# Patient Record
Sex: Male | Born: 1970 | Race: White | Hispanic: No | Marital: Married | Smoking: Heavy tobacco smoker
Health system: Southern US, Community
[De-identification: ages and names within clinical notes are randomized; demographics above are authoritative.]

## PROBLEM LIST (undated history)

## (undated) DIAGNOSIS — I10 Essential (primary) hypertension: Secondary | ICD-10-CM

---

## 2006-06-02 ENCOUNTER — Emergency Department: Payer: Self-pay | Admitting: Emergency Medicine

## 2016-04-18 DIAGNOSIS — S02609A Fracture of mandible, unspecified, initial encounter for closed fracture: Secondary | ICD-10-CM | POA: Insufficient documentation

## 2016-04-18 DIAGNOSIS — Z789 Other specified health status: Secondary | ICD-10-CM | POA: Insufficient documentation

## 2016-04-18 DIAGNOSIS — F17201 Nicotine dependence, unspecified, in remission: Secondary | ICD-10-CM | POA: Insufficient documentation

## 2016-04-18 DIAGNOSIS — Z7289 Other problems related to lifestyle: Secondary | ICD-10-CM | POA: Insufficient documentation

## 2016-04-18 DIAGNOSIS — I10 Essential (primary) hypertension: Secondary | ICD-10-CM | POA: Insufficient documentation

## 2017-12-07 ENCOUNTER — Other Ambulatory Visit: Payer: Self-pay | Admitting: *Deleted

## 2017-12-07 ENCOUNTER — Ambulatory Visit
Admission: RE | Admit: 2017-12-07 | Discharge: 2017-12-07 | Disposition: A | Discharge status: Home / Self Care | Payer: Disability Insurance | Source: Ambulatory Visit | Attending: *Deleted | Admitting: *Deleted

## 2017-12-07 ENCOUNTER — Ambulatory Visit
Admission: RE | Admit: 2017-12-07 | Discharge: 2017-12-07 | Disposition: A | Discharge status: Home / Self Care | Payer: Disability Insurance | Source: Ambulatory Visit | Attending: Ophthalmology | Admitting: Ophthalmology

## 2017-12-07 DIAGNOSIS — M47816 Spondylosis without myelopathy or radiculopathy, lumbar region: Secondary | ICD-10-CM | POA: Insufficient documentation

## 2017-12-07 DIAGNOSIS — G8929 Other chronic pain: Secondary | ICD-10-CM | POA: Diagnosis not present

## 2017-12-07 DIAGNOSIS — M542 Cervicalgia: Secondary | ICD-10-CM | POA: Diagnosis not present

## 2017-12-07 DIAGNOSIS — Z8781 Personal history of (healed) traumatic fracture: Secondary | ICD-10-CM | POA: Diagnosis not present

## 2017-12-07 DIAGNOSIS — I700 Atherosclerosis of aorta: Secondary | ICD-10-CM | POA: Insufficient documentation

## 2017-12-07 DIAGNOSIS — M79672 Pain in left foot: Secondary | ICD-10-CM | POA: Diagnosis not present

## 2017-12-07 DIAGNOSIS — M545 Low back pain: Principal | ICD-10-CM

## 2017-12-07 DIAGNOSIS — M899 Disorder of bone, unspecified: Secondary | ICD-10-CM | POA: Insufficient documentation

## 2017-12-20 ENCOUNTER — Other Ambulatory Visit: Payer: Self-pay

## 2017-12-20 ENCOUNTER — Encounter: Payer: Self-pay | Admitting: Emergency Medicine

## 2017-12-20 ENCOUNTER — Emergency Department: Payer: Self-pay

## 2017-12-20 ENCOUNTER — Inpatient Hospital Stay
Admission: EM | Admit: 2017-12-20 | Discharge: 2017-12-22 | DRG: 193 | Disposition: A | Discharge status: Home / Self Care | Payer: Self-pay | Attending: Internal Medicine | Admitting: Internal Medicine

## 2017-12-20 DIAGNOSIS — J440 Chronic obstructive pulmonary disease with acute lower respiratory infection: Secondary | ICD-10-CM | POA: Diagnosis present

## 2017-12-20 DIAGNOSIS — Z8042 Family history of malignant neoplasm of prostate: Secondary | ICD-10-CM

## 2017-12-20 DIAGNOSIS — J441 Chronic obstructive pulmonary disease with (acute) exacerbation: Secondary | ICD-10-CM

## 2017-12-20 DIAGNOSIS — J9601 Acute respiratory failure with hypoxia: Secondary | ICD-10-CM

## 2017-12-20 DIAGNOSIS — F17210 Nicotine dependence, cigarettes, uncomplicated: Secondary | ICD-10-CM | POA: Diagnosis present

## 2017-12-20 DIAGNOSIS — I10 Essential (primary) hypertension: Secondary | ICD-10-CM | POA: Diagnosis present

## 2017-12-20 DIAGNOSIS — Z23 Encounter for immunization: Secondary | ICD-10-CM

## 2017-12-20 DIAGNOSIS — J189 Pneumonia, unspecified organism: Principal | ICD-10-CM

## 2017-12-20 HISTORY — DX: Essential (primary) hypertension: I10

## 2017-12-20 LAB — FIBRIN DERIVATIVES D-DIMER (ARMC ONLY): Fibrin derivatives D-dimer (ARMC): 736.72 ng/mL (FEU) — ABNORMAL HIGH (ref 0.00–499.00)

## 2017-12-20 LAB — BRAIN NATRIURETIC PEPTIDE: B NATRIURETIC PEPTIDE 5: 26 pg/mL (ref 0.0–100.0)

## 2017-12-20 LAB — BASIC METABOLIC PANEL
Anion gap: 12 (ref 5–15)
BUN: 11 mg/dL (ref 6–20)
CO2: 21 mmol/L — ABNORMAL LOW (ref 22–32)
CREATININE: 0.8 mg/dL (ref 0.61–1.24)
Calcium: 8.6 mg/dL — ABNORMAL LOW (ref 8.9–10.3)
Chloride: 102 mmol/L (ref 101–111)
GFR calc Af Amer: >60 mL/min (ref >60)
Glucose, Bld: 118 mg/dL — ABNORMAL HIGH (ref 65–99)
POTASSIUM: 4.3 mmol/L (ref 3.5–5.1)
SODIUM: 135 mmol/L (ref 135–145)

## 2017-12-20 LAB — BLOOD GAS, VENOUS
Acid-base deficit: 2.4 mmol/L — ABNORMAL HIGH (ref 0.0–2.0)
Bicarbonate: 24.2 mmol/L (ref 20.0–28.0)
O2 SAT: 98 %
PATIENT TEMPERATURE: 37
pCO2, Ven: 47 mmHg (ref 44.0–60.0)
pH, Ven: 7.32 (ref 7.250–7.430)
pO2, Ven: 112 mmHg — ABNORMAL HIGH (ref 32.0–45.0)

## 2017-12-20 LAB — PROCALCITONIN

## 2017-12-20 LAB — CBC WITH DIFFERENTIAL/PLATELET
Basophils Absolute: 0 10*3/uL (ref 0–0.1)
Basophils Relative: 0 %
EOS ABS: 1.2 10*3/uL — AB (ref 0–0.7)
EOS PCT: 9 %
HCT: 46.1 % (ref 40.0–52.0)
Hemoglobin: 15.4 g/dL (ref 13.0–18.0)
Lymphocytes Relative: 7 %
Lymphs Abs: 0.9 10*3/uL — ABNORMAL LOW (ref 1.0–3.6)
MCH: 30.9 pg (ref 26.0–34.0)
MCHC: 33.4 g/dL (ref 32.0–36.0)
MCV: 92.6 fL (ref 80.0–100.0)
MONOS PCT: 4 %
Monocytes Absolute: 0.5 10*3/uL (ref 0.2–1.0)
Neutro Abs: 11.2 10*3/uL — ABNORMAL HIGH (ref 1.4–6.5)
Neutrophils Relative %: 80 %
PLATELETS: 201 10*3/uL (ref 150–440)
RBC: 4.98 MIL/uL (ref 4.40–5.90)
RDW: 13.8 % (ref 11.5–14.5)
WBC: 13.9 10*3/uL — ABNORMAL HIGH (ref 3.8–10.6)

## 2017-12-20 LAB — LIPID PANEL
Cholesterol: 144 mg/dL (ref 0–200)
HDL: 34 mg/dL — ABNORMAL LOW (ref >40)
LDL CALC: 94 mg/dL (ref 0–99)
Total CHOL/HDL Ratio: 4.2 RATIO
Triglycerides: 81 mg/dL (ref <150)
VLDL: 16 mg/dL (ref 0–40)

## 2017-12-20 LAB — HEMOGLOBIN A1C
Hgb A1c MFr Bld: 5.3 % (ref 4.8–5.6)
Mean Plasma Glucose: 105.41 mg/dL

## 2017-12-20 LAB — GLUCOSE, CAPILLARY: Glucose-Capillary: 114 mg/dL — ABNORMAL HIGH (ref 65–99)

## 2017-12-20 LAB — MRSA PCR SCREENING: MRSA by PCR: NEGATIVE

## 2017-12-20 LAB — TROPONIN I: Troponin I: <0.03 ng/mL (ref <0.03)

## 2017-12-20 LAB — LACTIC ACID, PLASMA: LACTIC ACID, VENOUS: 0.9 mmol/L (ref 0.5–1.9)

## 2017-12-20 MED ORDER — SODIUM CHLORIDE 0.9 % IV SOLN
500.0000 mg | Freq: Once | INTRAVENOUS | Status: AC
  Administered 2017-12-20: 500 mg via INTRAVENOUS
  Filled 2017-12-20: qty 500

## 2017-12-20 MED ORDER — PNEUMOCOCCAL VAC POLYVALENT 25 MCG/0.5ML IJ INJ
0.5000 mL | INJECTION | INTRAMUSCULAR | Status: AC
  Administered 2017-12-22: 0.5 mL via INTRAMUSCULAR
  Filled 2017-12-20: qty 0.5

## 2017-12-20 MED ORDER — MENTHOL 3 MG MT LOZG
1.0000 | LOZENGE | OROMUCOSAL | Status: DC | PRN
  Administered 2017-12-20: 3 mg via ORAL
  Filled 2017-12-20: qty 9

## 2017-12-20 MED ORDER — DEXTROSE 5 % IV SOLN: 250.0000 mg | INTRAVENOUS | Status: DC

## 2017-12-20 MED ORDER — PREDNISONE 50 MG PO TABS
50.0000 mg | ORAL_TABLET | Freq: Every day | ORAL | Status: DC
  Administered 2017-12-21 – 2017-12-22 (×2): 50 mg via ORAL
  Filled 2017-12-20 (×2): qty 1

## 2017-12-20 MED ORDER — IPRATROPIUM-ALBUTEROL 0.5-2.5 (3) MG/3ML IN SOLN
RESPIRATORY_TRACT | Status: AC
  Administered 2017-12-20: 3 mL via RESPIRATORY_TRACT
  Filled 2017-12-20: qty 6

## 2017-12-20 MED ORDER — BUDESONIDE 0.25 MG/2ML IN SUSP: 0.2500 mg | Freq: Two times a day (BID) | RESPIRATORY_TRACT | Status: DC

## 2017-12-20 MED ORDER — BUDESONIDE 0.5 MG/2ML IN SUSP
0.5000 mg | Freq: Two times a day (BID) | RESPIRATORY_TRACT | Status: DC
  Administered 2017-12-20 – 2017-12-22 (×4): 0.5 mg via RESPIRATORY_TRACT
  Filled 2017-12-20 (×4): qty 2

## 2017-12-20 MED ORDER — SODIUM CHLORIDE 0.9 % IV SOLN
1.0000 g | Freq: Once | INTRAVENOUS | Status: AC
  Administered 2017-12-20: 1 g via INTRAVENOUS
  Filled 2017-12-20: qty 10

## 2017-12-20 MED ORDER — IPRATROPIUM-ALBUTEROL 0.5-2.5 (3) MG/3ML IN SOLN: 3.0000 mL | RESPIRATORY_TRACT | Status: DC

## 2017-12-20 MED ORDER — DOCUSATE SODIUM 100 MG PO CAPS: 100.0000 mg | ORAL_CAPSULE | Freq: Two times a day (BID) | ORAL | Status: DC | PRN

## 2017-12-20 MED ORDER — ENOXAPARIN SODIUM 40 MG/0.4ML ~~LOC~~ SOLN
40.0000 mg | SUBCUTANEOUS | Status: DC
Start: 2017-12-20 — End: 2017-12-20

## 2017-12-20 MED ORDER — NICOTINE 21 MG/24HR TD PT24
21.0000 mg | MEDICATED_PATCH | Freq: Every day | TRANSDERMAL | Status: DC
  Administered 2017-12-20 – 2017-12-22 (×3): 21 mg via TRANSDERMAL
  Filled 2017-12-20 (×3): qty 1

## 2017-12-20 MED ORDER — TIOTROPIUM BROMIDE MONOHYDRATE 18 MCG IN CAPS
18.0000 ug | ORAL_CAPSULE | Freq: Every day | RESPIRATORY_TRACT | Status: DC
  Filled 2017-12-20: qty 5

## 2017-12-20 MED ORDER — ENOXAPARIN SODIUM 40 MG/0.4ML ~~LOC~~ SOLN
40.0000 mg | SUBCUTANEOUS | Status: DC
  Administered 2017-12-20 – 2017-12-22 (×3): 40 mg via SUBCUTANEOUS
  Filled 2017-12-20 (×3): qty 0.4

## 2017-12-20 MED ORDER — ACETAMINOPHEN 325 MG PO TABS
650.0000 mg | ORAL_TABLET | Freq: Four times a day (QID) | ORAL | Status: DC | PRN
  Administered 2017-12-20 – 2017-12-21 (×3): 650 mg via ORAL
  Filled 2017-12-20 (×2): qty 2

## 2017-12-20 MED ORDER — CEFTRIAXONE SODIUM 1 G IJ SOLR
1.0000 g | INTRAMUSCULAR | Status: DC
  Administered 2017-12-21 – 2017-12-22 (×2): 1 g via INTRAVENOUS
  Filled 2017-12-20: qty 10
  Filled 2017-12-20 (×2): qty 1

## 2017-12-20 MED ORDER — IPRATROPIUM BROMIDE 0.02 % IN SOLN
0.5000 mg | RESPIRATORY_TRACT | Status: DC
  Administered 2017-12-20 – 2017-12-21 (×7): 0.5 mg via RESPIRATORY_TRACT
  Filled 2017-12-20 (×7): qty 2.5

## 2017-12-20 MED ORDER — SODIUM CHLORIDE 0.9 % IV SOLN
500.0000 mg | INTRAVENOUS | Status: DC
  Administered 2017-12-21 – 2017-12-22 (×2): 500 mg via INTRAVENOUS
  Filled 2017-12-20 (×3): qty 500

## 2017-12-20 MED ORDER — METOPROLOL TARTRATE 5 MG/5ML IV SOLN
2.5000 mg | Freq: Four times a day (QID) | INTRAVENOUS | Status: DC | PRN
  Administered 2017-12-20: 2.5 mg via INTRAVENOUS
  Filled 2017-12-20: qty 5

## 2017-12-20 MED ORDER — SODIUM CHLORIDE 0.9 % IV SOLN: 1.0000 g | INTRAVENOUS | Status: DC

## 2017-12-20 MED ORDER — IPRATROPIUM-ALBUTEROL 0.5-2.5 (3) MG/3ML IN SOLN
3.0000 mL | Freq: Once | RESPIRATORY_TRACT | Status: AC
  Administered 2017-12-20: 3 mL via RESPIRATORY_TRACT

## 2017-12-20 MED ORDER — PHENOL 1.4 % MT LIQD
1.0000 | OROMUCOSAL | Status: DC | PRN
  Administered 2017-12-20: 1 via OROMUCOSAL
  Filled 2017-12-20: qty 177

## 2017-12-20 MED ORDER — METHYLPREDNISOLONE SODIUM SUCC 125 MG IJ SOLR
125.0000 mg | Freq: Once | INTRAMUSCULAR | Status: AC
  Administered 2017-12-20: 125 mg via INTRAVENOUS

## 2017-12-20 MED ORDER — OXYCODONE-ACETAMINOPHEN 5-325 MG PO TABS
1.0000 | ORAL_TABLET | ORAL | Status: DC | PRN
  Administered 2017-12-20 – 2017-12-22 (×6): 1 via ORAL
  Filled 2017-12-20 (×6): qty 1

## 2017-12-20 MED ORDER — SODIUM CHLORIDE 0.9 % IV BOLUS
1000.0000 mL | Freq: Once | INTRAVENOUS | Status: AC
  Administered 2017-12-20: 1000 mL via INTRAVENOUS

## 2017-12-20 MED ORDER — ALBUTEROL SULFATE (2.5 MG/3ML) 0.083% IN NEBU: 5.0000 mg | INHALATION_SOLUTION | Freq: Once | RESPIRATORY_TRACT | Status: DC

## 2017-12-20 MED ORDER — ACETAMINOPHEN 325 MG PO TABS
ORAL_TABLET | ORAL | Status: AC
  Filled 2017-12-20: qty 2

## 2017-12-20 MED ORDER — IPRATROPIUM-ALBUTEROL 0.5-2.5 (3) MG/3ML IN SOLN
RESPIRATORY_TRACT | Status: AC
  Administered 2017-12-20: 3 mL
  Filled 2017-12-20: qty 3

## 2017-12-20 MED ORDER — METHYLPREDNISOLONE SODIUM SUCC 125 MG IJ SOLR: 60.0000 mg | Freq: Four times a day (QID) | INTRAMUSCULAR | Status: DC

## 2017-12-20 MED ORDER — METHYLPREDNISOLONE SODIUM SUCC 125 MG IJ SOLR
INTRAMUSCULAR | Status: AC
  Administered 2017-12-20: 125 mg via INTRAVENOUS
  Filled 2017-12-20: qty 2

## 2017-12-20 NOTE — ED Notes (Signed)
Admitting provider in room at this time.  Will continue to monitor.

## 2017-12-20 NOTE — ED Provider Notes (Signed)
[facility] Emergency Department Provider Note   First MD Initiated Contact with Patient 12/20/17 [phone]     (approximate)  I have reviewed the triage vital signs and the nursing notes.   HISTORY  Chief Complaint Shortness of Breath and Cough    HPI Mathew Mitchell is a 47 y.o. male history of's cigarette smoking x25 years recently quit 5 days ago presents to the emergency department with shortness of breath and productive cough x1 week.  Patient denies any fever afebrile on presentation with temperature of 98.  Patient denies any lower extremity pain or swelling.  Patient states that he used nebulizer at home without any improvement of symptoms.  Patient denies any chest pain   Past medical history None There are no active problems to display for this patient.   Past surgical history Tracheostomy  Prior to Admission medications   Not on File    Allergies No known drug allergies History reviewed. No pertinent family history.  Social History Social History   Tobacco Use  . Smoking status: Heavy Tobacco Smoker  . Smokeless tobacco: Never Used  Substance Use Topics  . Alcohol use: Yes  . Drug use: Never    Review of Systems Constitutional: No fever/chills Eyes: No visual changes. ENT: No sore throat. Cardiovascular: Denies chest pain. Respiratory: Positive for dyspnea and cough Gastrointestinal: No abdominal pain.  No nausea, no vomiting.  No diarrhea.  No constipation. Genitourinary: Negative for dysuria. Musculoskeletal: Negative for neck pain.  Negative for back pain. Integumentary: Negative for rash. Neurological: Negative for headaches, focal weakness or numbness.   ____________________________________________   PHYSICAL EXAM:  VITAL SIGNS: ED Triage Vitals [12/20/17 0526]  Enc Vitals Group     BP (!) 162/105     Pulse Rate 88     Resp (!) 24     Temp 98 F (36.7 C)     Temp Source Oral     SpO2 (!) 88 %     Weight  101.6 kg (224 lb)     Height 1.88 m ( )     Head Circumference      Peak Flow      Pain Score 0     Pain Loc      Pain Edu?      Excl. in GC?     Constitutional: Alert and oriented.  Apparent respiratory distress Eyes: Conjunctivae are normal.  Head: Atraumatic. Mouth/Throat: Mucous membranes are moist.  Oropharynx non-erythematous. Neck: No stridor.   Cardiovascular: Normal rate, regular rhythm. Good peripheral circulation. Grossly normal heart sounds. Respiratory: Tachypnea, positive accessory respiratory muscle use, diffuse rhonchi with wheezing Gastrointestinal: Soft and nontender. No distention.  Musculoskeletal: No lower extremity tenderness nor edema. No gross deformities of extremities. Neurologic:  Normal speech and language. No gross focal neurologic deficits are appreciated.  Skin:  Skin is warm, dry and intact. No rash noted. Psychiatric: Mood and affect are normal. Speech and behavior are normal.  ____________________________________________   LABS (all labs ordered are listed, but only abnormal results are displayed)  Labs Reviewed  CBC WITH DIFFERENTIAL/PLATELET  BASIC METABOLIC PANEL  BRAIN NATRIURETIC PEPTIDE  TROPONIN I  FIBRIN DERIVATIVES D-DIMER ([facility] ONLY)  BLOOD GAS, VENOUS   ____________________________________________  EKG  ED ECG REPORT I, [place] BROWN, the attending physician, personally viewed and interpreted this ECG.   Date: 12/20/2017  EKG Time: 5:38 AM  Rate: 137  Rhythm: Sinus tachycardia  Axis: Normal  Intervals: Normal  ST&T  Change: None  ____________________________________________  RADIOLOGY I, [place] Dewayne Shorter, personally viewed and evaluated these images (plain radiographs) as part of my medical decision making, as well as reviewing the written report by the radiologist.  ED MD interpretation: Bibasilar opacities  Official radiology report(s): Dg Chest Portable 1 View  Result Date: 12/20/2017 CLINICAL DATA:   Acute onset of shortness of breath and cough. EXAM: PORTABLE CHEST 1 VIEW COMPARISON:  None. FINDINGS: The lungs are well-aerated. Minimal bibasilar opacities may reflect atelectasis. There is no evidence of pleural effusion or pneumothorax. The cardiomediastinal silhouette is within normal limits. No acute osseous abnormalities are seen. IMPRESSION: Minimal bibasilar opacities may reflect atelectasis. Lungs otherwise clear. Electronically Signed   By: Roanna Raider M.D.   On: 12/20/2017 06:14      .Critical Care Performed by: Darci Current, MD Authorized by: Darci Current, MD   Critical care provider statement:    Critical care time (minutes):  45   Critical care time was exclusive of:  Separately billable procedures and treating other patients   Critical care was necessary to treat or prevent imminent or life-threatening deterioration of the following conditions:  Respiratory failure   Critical care was time spent personally by me on the following activities:  Development of treatment plan with patient or surrogate, discussions with consultants, evaluation of patient's response to treatment, examination of patient, obtaining history from patient or surrogate, ordering and performing treatments and interventions, ordering and review of laboratory studies, ordering and review of radiographic studies, pulse oximetry, re-evaluation of patient's condition and review of old charts   I assumed direction of critical care for this patient from another provider in my specialty: no       ____________________________________________   INITIAL IMPRESSION / ASSESSMENT AND PLAN / ED COURSE  As part of my medical decision making, I reviewed the following data within the electronic MEDICAL RECORD NUMBER   47 year old male presents with above-stated history and physical exams secondary to respiratory distress.  Patient given 3 duo nebs in the emergency department as well as IV Solu-Medrol on 25 mg.  On  reevaluation patient continues to have diffuse rhonchi with expiratory wheezes as well as accessory muscle use tachypnea and as such BiPAP applied.  Chest x-ray revealed "bibasilar atelectasis however I suspect this to be pneumonia given clinical findings.  As such patient was given ceftriaxone 1 g and azithromycin 500 mg.  Patient discussed with Dr. Sheryle Hail for hospital admission further evaluation and management of acute hypoxia with respiratory failure ____________________________________________  FINAL CLINICAL IMPRESSION(S) / ED DIAGNOSES  Final diagnoses:  Community acquired pneumonia, unspecified laterality  Acute respiratory failure with hypoxia (HCC)     MEDICATIONS GIVEN DURING THIS VISIT:  Medications  sodium chloride 0.9 % bolus 1,000 mL (1,000 mLs Intravenous New Bag/Given 12/20/17 0607)  ipratropium-albuterol (DUONEB) 0.5-2.5 (3) MG/3ML nebulizer solution (3 mLs  Given 12/20/17 0540)  ipratropium-albuterol (DUONEB) 0.5-2.5 (3) MG/3ML nebulizer solution 3 mL (3 mLs Nebulization Given 12/20/17 0540)  methylPREDNISolone sodium succinate (SOLU-MEDROL) 125 mg/2 mL injection 125 mg (125 mg Intravenous Given 12/20/17 0540)  ipratropium-albuterol (DUONEB) 0.5-2.5 (3) MG/3ML nebulizer solution 3 mL (3 mLs Nebulization Given 12/20/17 0541)     ED Discharge Orders    None       Note:  This document was prepared using Dragon voice recognition software and may include unintentional dictation errors.    Darci Current, MD 12/20/17 [phone]

## 2017-12-20 NOTE — ED Notes (Signed)
Lab called about blood work, states they see orders now and will run the blood specimens.

## 2017-12-20 NOTE — ED Notes (Signed)
ED Provider at bedside. 

## 2017-12-20 NOTE — ED Notes (Signed)
Pt being placed on bipap 

## 2017-12-20 NOTE — H&P (Signed)
[employer] [facility] [place] at [facility]   PATIENT NAME: Mathew Mitchell    MR#:  [id number]  DATE OF BIRTH:  1971-04-07  DATE OF ADMISSION:  12/20/2017  PRIMARY CARE PHYSICIAN: Patient, No Pcp Per   REQUESTING/REFERRING PHYSICIAN: Manson Passey  CHIEF COMPLAINT:   Chief Complaint  Patient presents with  . Shortness of Breath  . Cough    HISTORY OF PRESENT ILLNESS: Mathew Mitchell  is a 47 y.o. male with a known history of htn, Does not go to a doctor- smokes 2 packs of cigarates daily- came to ER for having SOB and cough worsening for 4 days. Also have brownish sputum. No fever, chills, chest pain. Noted to be hypoxic in ER. Have some infiltrate in Xray. Did not benefit by nasal canula or mask, so started on bipap and feels batter with that.  PAST MEDICAL HISTORY:   Past Medical History:  Diagnosis Date  . Hypertension     PAST SURGICAL HISTORY: History reviewed. No pertinent surgical history.  SOCIAL HISTORY:  Social History   Tobacco Use  . Smoking status: Heavy Tobacco Smoker    Packs/day: 2.00  . Smokeless tobacco: Never Used  Substance Use Topics  . Alcohol use: Yes    FAMILY HISTORY:  Family History  Problem Relation Age of Onset  . Diabetes Mother   . Prostate cancer Father     DRUG ALLERGIES: No Known Allergies  REVIEW OF SYSTEMS:   CONSTITUTIONAL: No fever, fatigue or weakness.  EYES: No blurred or double vision.  EARS, NOSE, AND THROAT: No tinnitus or ear pain.  RESPIRATORY: have cough, shortness of breath,no wheezing or hemoptysis.  CARDIOVASCULAR: No chest pain, orthopnea, edema.  GASTROINTESTINAL: No nausea, vomiting, diarrhea or abdominal pain.  GENITOURINARY: No dysuria, hematuria.  ENDOCRINE: No polyuria, nocturia,  HEMATOLOGY: No anemia, easy bruising or bleeding SKIN: No rash or lesion. MUSCULOSKELETAL: No joint pain or arthritis.   NEUROLOGIC: No tingling, numbness, weakness.  PSYCHIATRY: No anxiety or depression.   MEDICATIONS AT  HOME:  Prior to Admission medications   Medication Sig Start Date End Date Taking? Authorizing Provider  guaiFENesin (MUCINEX) 600 MG 12 hr tablet Take 600 mg by mouth 2 (two) times daily as needed for cough.   Yes [provider]  ibuprofen (ADVIL,MOTRIN) 200 MG tablet Take 200 mg by mouth every 6 (six) hours as needed for headache or mild pain.   Yes [provider]      PHYSICAL EXAMINATION:   VITAL SIGNS: Blood pressure (!) 144/95, pulse (!) 130, temperature 98 F (36.7 C), temperature source Oral, resp. rate (!) 25, height  (1.88 m), weight 101.6 kg (224 lb), SpO2 93 %.  GENERAL:  47 y.o.-year-old patient lying in the bed with no acute distress.  EYES: Pupils equal, round, reactive to light and accommodation. No scleral icterus. Extraocular muscles intact.  HEENT: Head atraumatic, normocephalic. Oropharynx and nasopharynx clear.  NECK:  Supple, no jugular venous distention. No thyroid enlargement, no tenderness.  LUNGS: Normal breath sounds bilaterally, some wheezing, some crepitation. Positive for use of accessory muscles of respiration. On bipap. CARDIOVASCULAR: S1, S2 normal. No murmurs, rubs, or gallops.  ABDOMEN: Soft, nontender, nondistended. Bowel sounds present. No organomegaly or mass.  EXTREMITIES: No pedal edema, cyanosis, or clubbing.  NEUROLOGIC: Cranial nerves II through XII are intact. Muscle strength 5/5 in all extremities. Sensation intact. Gait not checked.  PSYCHIATRIC: The patient is alert and oriented x 3.  SKIN: No obvious rash, lesion, or ulcer.  LABORATORY PANEL:   CBC Recent Labs  Lab 12/20/17 0659  WBC 13.9*  HGB 15.4  HCT 46.1  PLT 201  MCV 92.6  MCH 30.9  MCHC 33.4  RDW 13.8  LYMPHSABS 0.9*  MONOABS 0.5  EOSABS 1.2*  BASOSABS 0.0   ------------------------------------------------------------------------------------------------------------------  Chemistries  Recent Labs  Lab 12/20/17 0659  NA 135  K 4.3  CL  102  CO2 21*  GLUCOSE 118*  BUN 11  CREATININE 0.80  CALCIUM 8.6*   ------------------------------------------------------------------------------------------------------------------ estimated creatinine clearance is 143.8 mL/min (by C-G formula based on SCr of 0.8 mg/dL). ------------------------------------------------------------------------------------------------------------------ No results for input(s): TSH, T4TOTAL, T3FREE, THYROIDAB in the last 72 hours.  Invalid input(s): FREET3   Coagulation profile No results for input(s): INR, PROTIME in the last 168 hours. ------------------------------------------------------------------------------------------------------------------- No results for input(s): DDIMER in the last 72 hours. -------------------------------------------------------------------------------------------------------------------  Cardiac Enzymes Recent Labs  Lab 12/20/17 0659  TROPONINI <0.03   ------------------------------------------------------------------------------------------------------------------ Invalid input(s): POCBNP  ---------------------------------------------------------------------------------------------------------------  Urinalysis No results found for: COLORURINE, APPEARANCEUR, LABSPEC, PHURINE, GLUCOSEU, HGBUR, BILIRUBINUR, KETONESUR, PROTEINUR, UROBILINOGEN, NITRITE, LEUKOCYTESUR   RADIOLOGY: Dg Chest Portable 1 View  Result Date: 12/20/2017 CLINICAL DATA:  Acute onset of shortness of breath and cough. EXAM: PORTABLE CHEST 1 VIEW COMPARISON:  None. FINDINGS: The lungs are well-aerated. Minimal bibasilar opacities may reflect atelectasis. There is no evidence of pleural effusion or pneumothorax. The cardiomediastinal silhouette is within normal limits. No acute osseous abnormalities are seen. IMPRESSION: Minimal bibasilar opacities may reflect atelectasis. Lungs otherwise clear. Electronically Signed   By: Roanna Raider M.D.   On:  12/20/2017 06:14    EKG: Orders placed or performed during the hospital encounter of 12/20/17  . ED EKG  . ED EKG  . EKG 12-Lead  . EKG 12-Lead  . ED EKG  . ED EKG    IMPRESSION AND PLAN:  * Ac hypoxic respi failure   COPD exacerbation   Commu. Acquired Pneumonia    bipap in use, try to taper.   IV and inhaled steroids   Nebs and spiriva   Rocephin + azithromycin   Check Procalcitonin  * htn    Monitor  * Active smoking   Counseled for 4 min, offered on nicotine patch  *  No health maintenance appointments   Check HBA1c and lipid panel.   All the records are reviewed and case discussed with ED provider. Management plans discussed with the patient, family and they are in agreement.  CODE STATUS: full.  Discussed with wife in room and with ICU physician Dr. Duanne Limerick  TOTAL TIME TAKING CARE OF THIS PATIENT: 50 critical care minutes.    Altamese Dilling M.D on 12/20/2017   Between 7am to 6pm - Pager - [phone]  After 6pm go to [email or web address] - password [email or web address]  [facility] [place] [facility]  Office  [phone]  CC: Primary care physician; Patient, No Pcp Per   Note: This dictation was prepared with Dragon dictation along with smaller phrase technology. Any transcriptional errors that result from this process are unintentional.

## 2017-12-20 NOTE — ED Notes (Signed)
Pt placed on nonrebreather, pt tried nasal canula on 5L however states he would like a mask to his face, states hx of nasal trauma.

## 2017-12-20 NOTE — ED Triage Notes (Signed)
Pt arrived to the ED accompanied by his wife for complaints of shortness of breath for the last day and cough for the last week. Pt reports that he has been utilizing over the counter medication and nebulizer without help. Pt is AOx4 in moderate respiratory distress.

## 2017-12-20 NOTE — Consult Note (Signed)
   Name: Mathew Mitchell MRN: [id number] DOB: 1970/12/08     CONSULTATION DATE: 12/20/2017 47 years old man with history of hypertension and chronic tobacco abuse.  He presented to the ID with shortness of breath with hypoxemia and had to be started on a BiPAP. Patient arrived to ICU on BiPAP setting of 10/5 35% in no distress.  All history was obtained from primary care physician, the patient and EMR.  Patient mentioned that he is a smoker, is not in any breathing treatment at home, received 1 dose of albuterol which was a prescription for his daughter followed by allergic reaction described as rash and itching to both hands.   PAST MEDICAL HISTORY :   has a past medical history of Hypertension.  has no past surgical history on file. Prior to Admission medications   Medication Sig Start Date End Date Taking? Authorizing Provider  guaiFENesin (MUCINEX) 600 MG 12 hr tablet Take 600 mg by mouth 2 (two) times daily as needed for cough.   Yes [provider]  ibuprofen (ADVIL,MOTRIN) 200 MG tablet Take 200 mg by mouth every 6 (six) hours as needed for headache or mild pain.   Yes [provider]   Allergies  Allergen Reactions  . Albuterol Sulfate Rash    FAMILY HISTORY:  family history includes Diabetes in his mother; Prostate cancer in his father. SOCIAL HISTORY:  reports that he has been smoking.  He has been smoking about 2.00 packs per day. He has never used smokeless tobacco. He reports that he drinks alcohol. He reports that he does not use drugs.  REVIEW OF SYSTEMS:   Unable to obtain due to critical illness   VITAL SIGNS: Temp:  [98 F (36.7 C)-98.4 F (36.9 C)] 98.4 F (36.9 C) (05/30 0942) Pulse Rate:  [88-139] 133 (05/30 0942) Resp:  [22-38] 26 (05/30 0942) BP: (123-169)/(83-145) 169/113 (05/30 0942) SpO2:  [88 %-98 %] 94 % (05/30 0942) FiO2 (%):  [35 %] 35 % (05/30 0942) Weight:  [99.6 kg (219 lb 9.3 oz)-101.6 kg (224 lb)] 99.6 kg (219 lb 9.3 oz)  (05/30 4098)  Physical Examination:  Awake and oriented no acute focal neurological deficits On BiPAP 10/5 35%, no distress, air entry is equal bilateral and diffuse expiratory wheezes. S1 and S2 are audible with no murmur Benign abdominal exam with normal peristalsis Extremities are within normal and no edema   ASSESSMENT / PLAN: Acute respiratory failure tolerating BiPAP -Monitor ABG, optimize BiPAP settings and consider intubation if no improvement.  Acute exacerbation of COPD -Systemic steroids + inhaled steroids + bronchodilator (Atrovent, patient is allergic to albuterol) + antibiotics (Rocephin + Zithromax).  Hypertension -Optimize antihypertensives and monitor hemodynamics  Chronic tobacco abuse -NicoDerm.  Full code  DVT & GI prophylaxis.  Continue with supportive care.  Critical care time 45 minutes

## 2017-12-20 NOTE — ED Notes (Addendum)
Pt able to answer questions is A&O at this time. Pt denies hx of asthma, COPD of CHF. Pt denies CP. Pt states he has had a cough last 4 days and feels sore when he coughs.

## 2017-12-20 NOTE — ED Notes (Signed)
EDP aware of pt's HR of 139, verbal for bolus of 1L NS, no other orders at this time.

## 2017-12-21 ENCOUNTER — Inpatient Hospital Stay: Payer: Self-pay

## 2017-12-21 ENCOUNTER — Other Ambulatory Visit: Payer: Self-pay

## 2017-12-21 LAB — BASIC METABOLIC PANEL
Anion gap: 10 (ref 5–15)
BUN: 16 mg/dL (ref 6–20)
CO2: 23 mmol/L (ref 22–32)
Calcium: 8.8 mg/dL — ABNORMAL LOW (ref 8.9–10.3)
Chloride: 102 mmol/L (ref 101–111)
Creatinine, Ser: 0.99 mg/dL (ref 0.61–1.24)
GFR calc Af Amer: >60 mL/min (ref >60)
GFR calc non Af Amer: >60 mL/min (ref >60)
GLUCOSE: 121 mg/dL — AB (ref 65–99)
POTASSIUM: 3.8 mmol/L (ref 3.5–5.1)
SODIUM: 135 mmol/L (ref 135–145)

## 2017-12-21 LAB — CBC
HEMATOCRIT: 42.4 % (ref 40.0–52.0)
Hemoglobin: 14.5 g/dL (ref 13.0–18.0)
MCH: 31.5 pg (ref 26.0–34.0)
MCHC: 34.1 g/dL (ref 32.0–36.0)
MCV: 92.4 fL (ref 80.0–100.0)
Platelets: 198 10*3/uL (ref 150–440)
RBC: 4.59 MIL/uL (ref 4.40–5.90)
RDW: 13.4 % (ref 11.5–14.5)
WBC: 17.1 10*3/uL — AB (ref 3.8–10.6)

## 2017-12-21 LAB — PROCALCITONIN: Procalcitonin: <0.1 ng/mL

## 2017-12-21 MED ORDER — IPRATROPIUM BROMIDE 0.02 % IN SOLN
0.5000 mg | Freq: Four times a day (QID) | RESPIRATORY_TRACT | Status: DC
  Administered 2017-12-21 – 2017-12-22 (×3): 0.5 mg via RESPIRATORY_TRACT
  Filled 2017-12-21 (×3): qty 2.5

## 2017-12-21 MED ORDER — GUAIFENESIN 100 MG/5ML PO SOLN
5.0000 mL | ORAL | Status: DC | PRN
  Administered 2017-12-21 (×4): 100 mg via ORAL
  Filled 2017-12-21 (×6): qty 5

## 2017-12-21 NOTE — Care Management (Signed)
RNCM met with patient as he shows no PCP and no health insurance.  He states he already has an appointment with [facility]

## 2017-12-21 NOTE — Progress Notes (Signed)
[employer] - [place] at [facility]   PATIENT NAME: Mathew Mitchell    MR#:  [id number]  DATE OF BIRTH:  [birth year]  SUBJECTIVE:  CHIEF COMPLAINT:   Chief Complaint  Patient presents with  . Shortness of Breath  . Cough   Came with cough and SOB, was on bipap. Now on nasal canula oxygen. REVIEW OF SYSTEMS:  CONSTITUTIONAL: No fever, fatigue or weakness.  EYES: No blurred or double vision.  EARS, NOSE, AND THROAT: No tinnitus or ear pain.  RESPIRATORY: No cough, have shortness of breath, wheezing ,no hemoptysis.  CARDIOVASCULAR: No chest pain, orthopnea, edema.  GASTROINTESTINAL: No nausea, vomiting, diarrhea or abdominal pain.  GENITOURINARY: No dysuria, hematuria.  ENDOCRINE: No polyuria, nocturia,  HEMATOLOGY: No anemia, easy bruising or bleeding SKIN: No rash or lesion. MUSCULOSKELETAL: No joint pain or arthritis.   NEUROLOGIC: No tingling, numbness, weakness.  PSYCHIATRY: No anxiety or depression.   ROS  DRUG ALLERGIES:   Allergies  Allergen Reactions  . Albuterol Sulfate Rash    VITALS:  Blood pressure (!) 148/98, pulse (!) 113, temperature 98.1 F (36.7 C), resp. rate 18, height  (1.905 m), weight 99.6 kg (219 lb 9.3 oz), SpO2 92 %.  PHYSICAL EXAMINATION:  GENERAL:  47 y.o.-year-old patient lying in the bed with no acute distress.  EYES: Pupils equal, round, reactive to light and accommodation. No scleral icterus. Extraocular muscles intact.  HEENT: Head atraumatic, normocephalic. Oropharynx and nasopharynx clear.  NECK:  Supple, no jugular venous distention. No thyroid enlargement, no tenderness.  LUNGS: Normal breath sounds bilaterally, b/l wheezing, no crepitation. No use of accessory muscles of respiration.  CARDIOVASCULAR: S1, S2 normal. No murmurs, rubs, or gallops.  ABDOMEN: Soft, nontender, nondistended. Bowel sounds present. No organomegaly or mass.  EXTREMITIES: No pedal edema, cyanosis, or clubbing.  NEUROLOGIC: Cranial nerves II  through XII are intact. Muscle strength 5/5 in all extremities. Sensation intact. Gait not checked.  PSYCHIATRIC: The patient is alert and oriented x 3.  SKIN: No obvious rash, lesion, or ulcer.   Physical Exam LABORATORY PANEL:   CBC Recent Labs  Lab 12/21/17 0703  WBC 17.1*  HGB 14.5  HCT 42.4  PLT 198   ------------------------------------------------------------------------------------------------------------------  Chemistries  Recent Labs  Lab 12/21/17 0703  NA 135  K 3.8  CL 102  CO2 23  GLUCOSE 121*  BUN 16  CREATININE 0.99  CALCIUM 8.8*   ------------------------------------------------------------------------------------------------------------------  Cardiac Enzymes Recent Labs  Lab 12/20/17 0659  TROPONINI <0.03   ------------------------------------------------------------------------------------------------------------------  RADIOLOGY:  Dg Chest 2 View  Result Date: 12/21/2017 CLINICAL DATA:  Shortness of breath EXAM: CHEST - 2 VIEW COMPARISON:  Dec 20, 2017 FINDINGS: There is no edema or consolidation. There is mild scarring in each upper lobe. Heart size and pulmonary vascularity are normal. No adenopathy. No evident bone lesions. IMPRESSION: Mild scarring in the upper lobes.  No edema or consolidation. Electronically Signed   By: Bretta Bang III M.D.   On: 12/21/2017 07:21   Dg Chest Portable 1 View  Result Date: 12/20/2017 CLINICAL DATA:  Acute onset of shortness of breath and cough. EXAM: PORTABLE CHEST 1 VIEW COMPARISON:  None. FINDINGS: The lungs are well-aerated. Minimal bibasilar opacities may reflect atelectasis. There is no evidence of pleural effusion or pneumothorax. The cardiomediastinal silhouette is within normal limits. No acute osseous abnormalities are seen. IMPRESSION: Minimal bibasilar opacities may reflect atelectasis. Lungs otherwise clear. Electronically Signed   By: Roanna Raider M.D.   On: 12/20/2017  06:14     ASSESSMENT AND PLAN:   Principal Problem:   Acute respiratory failure with hypoxia (HCC) Active Problems:   COPD with acute exacerbation (HCC)   Pneumonia  * Ac hypoxic respi failure   COPD exacerbation   Commu. Acquired Pneumonia    bipap on admission, now on nasal canula oxygen.   IV and inhaled steroids   Nebs and spiriva   Rocephin + azithromycin   Check Procalcitonin- not high.  * htn    Monitor  * Active smoking   Counseled for 4 min, offered on nicotine patch  *  No health maintenance appointments   Checked HBA1c and lipid panel. Not high.    All the records are reviewed and case discussed with Care Management/Social Workerr. Management plans discussed with the patient, family and they are in agreement.  CODE STATUS: full.  TOTAL TIME TAKING CARE OF THIS PATIENT: 35 minutes.   POSSIBLE D/C IN 1-2 DAYS, DEPENDING ON CLINICAL CONDITION.   Altamese Dilling M.D on 12/21/2017   Between 7am to 6pm - Pager - [phone]  After 6pm go to [email or web address] - password [email or web address]  [facility] [place] [facility]  Office  [phone]  CC: Primary care physician; Patient, No Pcp Per  Note: This dictation was prepared with Dragon dictation along with smaller phrase technology. Any transcriptional errors that result from this process are unintentional.

## 2017-12-22 LAB — HIV ANTIBODY (ROUTINE TESTING W REFLEX): HIV Screen 4th Generation wRfx: NONREACTIVE

## 2017-12-22 LAB — PROCALCITONIN: Procalcitonin: 0.13 ng/mL

## 2017-12-22 MED ORDER — PREDNISONE 10 MG (21) PO TBPK: ORAL_TABLET | ORAL | 0 refills | Status: DC

## 2017-12-22 MED ORDER — GUAIFENESIN 100 MG/5ML PO SOLN: 5.0000 mL | ORAL | 0 refills | Status: DC | PRN

## 2017-12-22 MED ORDER — AZITHROMYCIN 250 MG PO TABS: 250.0000 mg | ORAL_TABLET | Freq: Every day | ORAL | 0 refills | Status: AC

## 2017-12-22 MED ORDER — TIOTROPIUM BROMIDE MONOHYDRATE 18 MCG IN CAPS: 18.0000 ug | ORAL_CAPSULE | Freq: Every day | RESPIRATORY_TRACT | 2 refills | Status: AC

## 2017-12-22 MED ORDER — CEFUROXIME AXETIL 250 MG PO TABS: 250.0000 mg | ORAL_TABLET | Freq: Two times a day (BID) | ORAL | 0 refills | Status: AC

## 2017-12-22 NOTE — Progress Notes (Signed)
This nurse ambulated patient around nurses station once. O2 sats 92-93% on room air, no shortness of breath while walking.

## 2017-12-22 NOTE — Discharge Summary (Signed)
[facility] - [place] at [facility]   PATIENT NAME: Mathew Mitchell    MR#:  [id number]  DATE OF BIRTH:  [birth year]  DATE OF ADMISSION:  12/20/2017 ADMITTING PHYSICIAN: Altamese Dilling, MD  DATE OF DISCHARGE: 12/22/2017   PRIMARY CARE PHYSICIAN: Patient, No Pcp Per    ADMISSION DIAGNOSIS:  Acute respiratory failure with hypoxia (HCC) [J96.01] Community acquired pneumonia, unspecified laterality [J18.9]  DISCHARGE DIAGNOSIS:  Principal Problem:   Acute respiratory failure with hypoxia (HCC) Active Problems:   COPD with acute exacerbation (HCC)   Pneumonia   SECONDARY DIAGNOSIS:   Past Medical History:  Diagnosis Date  . Hypertension     HOSPITAL COURSE:   * Ac hypoxic respi failure COPD exacerbation Commu. Acquired Pneumonia  bipap on admission, now on nasal canula oxygen. IV and inhaled steroids Nebs and spiriva Rocephin + azithromycin Check Procalcitonin- not high.   Pt felt much better, off oxygen and able to walk without any SOB or trouble, d/c home.   He had some reaction to albuterol nebs in past, so will avoid that for now.  * htn Monitor  * Active smoking Counseled for 4 min, offered on nicotine patch  * No health maintenance appointments Checked HBA1c and lipid panel. Not high.    DISCHARGE CONDITIONS:   Stable.  CONSULTS OBTAINED:    DRUG ALLERGIES:   Allergies  Allergen Reactions  . Albuterol Sulfate Rash    DISCHARGE MEDICATIONS:   Allergies as of 12/22/2017      Reactions   Albuterol Sulfate Rash      Medication List    TAKE these medications   azithromycin 250 MG tablet Commonly known as:  ZITHROMAX Z-PAK Take 1 tablet (250 mg total) by mouth daily for 3 days.   cefUROXime 250 MG tablet Commonly known as:  CEFTIN Take 1 tablet (250 mg total) by mouth 2 (two) times daily for 3 days.   guaiFENesin 600 MG 12 hr tablet Commonly known as:  MUCINEX Take 600 mg by mouth  2 (two) times daily as needed for cough. What changed:  Another medication with the same name was added. Make sure you understand how and when to take each.   guaiFENesin 100 MG/5ML Soln Commonly known as:  ROBITUSSIN Take 5 mLs (100 mg total) by mouth every 4 (four) hours as needed for cough or to loosen phlegm. What changed:  You were already taking a medication with the same name, and this prescription was added. Make sure you understand how and when to take each.   ibuprofen 200 MG tablet Commonly known as:  ADVIL,MOTRIN Take 200 mg by mouth every 6 (six) hours as needed for headache or mild pain.   predniSONE 10 MG (21) Tbpk tablet Commonly known as:  STERAPRED UNI-PAK 21 TAB Take 6 tabs first day, 5 tab on day 2, then 4 on day 3rd, 3 tabs on day 4th , 2 tab on day 5th, and 1 tab on 6th day.   tiotropium 18 MCG inhalation capsule Commonly known as:  SPIRIVA HANDIHALER Place 1 capsule (18 mcg total) into inhaler and inhale daily.        DISCHARGE INSTRUCTIONS:    Follow with PMD in 1-2 weeks.  If you experience worsening of your admission symptoms, develop shortness of breath, life threatening emergency, suicidal or homicidal thoughts you must seek medical attention immediately by calling 911 or calling your MD immediately  if symptoms less severe.  You Must read complete instructions/literature along  with all the possible adverse reactions/side effects for all the Medicines you take and that have been prescribed to you. Take any new Medicines after you have completely understood and accept all the possible adverse reactions/side effects.   Please note  You were cared for by a hospitalist during your hospital stay. If you have any questions about your discharge medications or the care you received while you were in the hospital after you are discharged, you can call the unit and asked to speak with the hospitalist on call if the hospitalist that took care of you is not  available. Once you are discharged, your primary care physician will handle any further medical issues. Please note that NO REFILLS for any discharge medications will be authorized once you are discharged, as it is imperative that you return to your primary care physician (or establish a relationship with a primary care physician if you do not have one) for your aftercare needs so that they can reassess your need for medications and monitor your lab values.    Today   CHIEF COMPLAINT:   Chief Complaint  Patient presents with  . Shortness of Breath  . Cough    HISTORY OF PRESENT ILLNESS:  Mathew Mitchell  is a 47 y.o. male with a known history of htn, Does not go to a doctor- smokes 2 packs of cigarates daily- came to ER for having SOB and cough worsening for 4 days. Also have brownish sputum. No fever, chills, chest pain. Noted to be hypoxic in ER. Have some infiltrate in Xray. Did not benefit by nasal canula or mask, so started on bipap and feels batter with that.   VITAL SIGNS:  Blood pressure (!) 142/97, pulse 90, temperature 97.9 F (36.6 C), temperature source Oral, resp. rate 18, height 6\' 3"  (1.905 m), weight 99.6 kg (219 lb 9.3 oz), SpO2 96 %.  I/O:    Intake/Output Summary (Last 24 hours) at 12/22/2017 0936 Last data filed at 12/22/2017 0500 Gross per 24 hour  Intake 1670 ml  Output -  Net 1670 ml    PHYSICAL EXAMINATION:  GENERAL:  47 y.o.-year-old patient lying in the bed with no acute distress.  EYES: Pupils equal, round, reactive to light and accommodation. No scleral icterus. Extraocular muscles intact.  HEENT: Head atraumatic, normocephalic. Oropharynx and nasopharynx clear.  NECK:  Supple, no jugular venous distention. No thyroid enlargement, no tenderness.  LUNGS: Normal breath sounds bilaterally, no wheezing, rales,rhonchi or crepitation. No use of accessory muscles of respiration.  CARDIOVASCULAR: S1, S2 normal. No murmurs, rubs, or gallops.  ABDOMEN: Soft,  non-tender, non-distended. Bowel sounds present. No organomegaly or mass.  EXTREMITIES: No pedal edema, cyanosis, or clubbing.  NEUROLOGIC: Cranial nerves II through XII are intact. Muscle strength 5/5 in all extremities. Sensation intact. Gait not checked.  PSYCHIATRIC: The patient is alert and oriented x 3.  SKIN: No obvious rash, lesion, or ulcer.   DATA REVIEW:   CBC Recent Labs  Lab 12/21/17 0703  WBC 17.1*  HGB 14.5  HCT 42.4  PLT 198    Chemistries  Recent Labs  Lab 12/21/17 0703  NA 135  K 3.8  CL 102  CO2 23  GLUCOSE 121*  BUN 16  CREATININE 0.99  CALCIUM 8.8*    Cardiac Enzymes Recent Labs  Lab 12/20/17 0659  TROPONINI <0.03    Microbiology Results  Results for orders placed or performed during the hospital encounter of 12/20/17  Blood culture (routine x 2)  Status: None (Preliminary result)   Collection Time: 12/20/17  6:42 AM  Result Value Ref Range Status   Specimen Description BLOOD LEFT FA  Final   Special Requests   Final    BOTTLES DRAWN AEROBIC AND ANAEROBIC Blood Culture adequate volume   Culture   Final    NO GROWTH 2 DAYS Performed at [facility], [address] [place], [place] [address]    Report Status PENDING  Incomplete  Blood culture (routine x 2)     Status: None (Preliminary result)   Collection Time: 12/20/17  6:42 AM  Result Value Ref Range Status   Specimen Description BLOOD RIGHT FA  Final   Special Requests   Final    BOTTLES DRAWN AEROBIC AND ANAEROBIC Blood Culture adequate volume   Culture   Final    NO GROWTH 2 DAYS Performed at [facility], [address] [place], [place] [address]    Report Status PENDING  Incomplete  MRSA PCR Screening     Status: None   Collection Time: 12/20/17 10:16 AM  Result Value Ref Range Status   MRSA by PCR NEGATIVE NEGATIVE Final    Comment:        The GeneXpert MRSA Assay (FDA approved for NASAL specimens only), is one component of a comprehensive MRSA  colonization surveillance program. It is not intended to diagnose MRSA infection nor to guide or monitor treatment for MRSA infections. Performed at [facility], [address] [place], [place] [address]     RADIOLOGY:  Dg Chest 2 View  Result Date: 12/21/2017 CLINICAL DATA:  Shortness of breath EXAM: CHEST - 2 VIEW COMPARISON:  Dec 20, 2017 FINDINGS: There is no edema or consolidation. There is mild scarring in each upper lobe. Heart size and pulmonary vascularity are normal. No adenopathy. No evident bone lesions. IMPRESSION: Mild scarring in the upper lobes.  No edema or consolidation. Electronically Signed   By: Bretta BangWilliam  Woodruff III M.D.   On: 12/21/2017 07:21    EKG:   Orders placed or performed during the hospital encounter of 12/20/17  . ED EKG  . ED EKG  . EKG 12-Lead  . EKG 12-Lead  . ED EKG  . ED EKG      Management plans discussed with the patient, family and they are in agreement.  CODE STATUS:     Code Status Orders  (From admission, onward)        Start     Ordered   12/20/17 0942  Full code  Continuous     12/20/17 0941    Code Status History    This patient has a current code status but no historical code status.      TOTAL TIME TAKING CARE OF THIS PATIENT: 35 minutes.    Altamese DillingVaibhavkumar Thyra Yinger M.D on 12/22/2017 at 9:36 AM  Between 7am to 6pm - Pager - [phone]  After 6pm go to [email or web address] - password [email or web address]  [facility]  Office  [phone]  CC: Primary care physician; Patient, No Pcp Per   Note: This dictation was prepared with Dragon dictation along with smaller phrase technology. Any transcriptional errors that result from this process are unintentional.

## 2017-12-22 NOTE — Care Management (Signed)
Discharge to home today per Dr. Elisabeth PigeonVachhani. Patient states he has a follow-up appointment for [facility]. Friend/family will transport Gwenette GreetBrenda S Lucienne Sawyers RN MSN CCM Care Management [phone]

## 2017-12-22 NOTE — Progress Notes (Signed)
Patient discharging home. VSS, A&O, no complaints at this time. Discharge instructions and prescriptions given to patient, verbalized understanding. Waitng on family/friend to transport patient home.

## 2017-12-25 LAB — CULTURE, BLOOD (ROUTINE X 2)
CULTURE: NO GROWTH
Culture: NO GROWTH
SPECIAL REQUESTS: ADEQUATE
Special Requests: ADEQUATE

## 2019-04-10 IMAGING — CR DG FOOT 2V*L*
1 series · 2 of 2 positions shown · non-contrast
Comparison: None.

CLINICAL DATA: Chronic LEFT foot pain.  History of remote injury.

EXAM:
LEFT FOOT - 2 VIEW

[Series 1: dg foot 2 views left · 0.14mm/px · 2 of 2 slices shown]
[im 1/2]
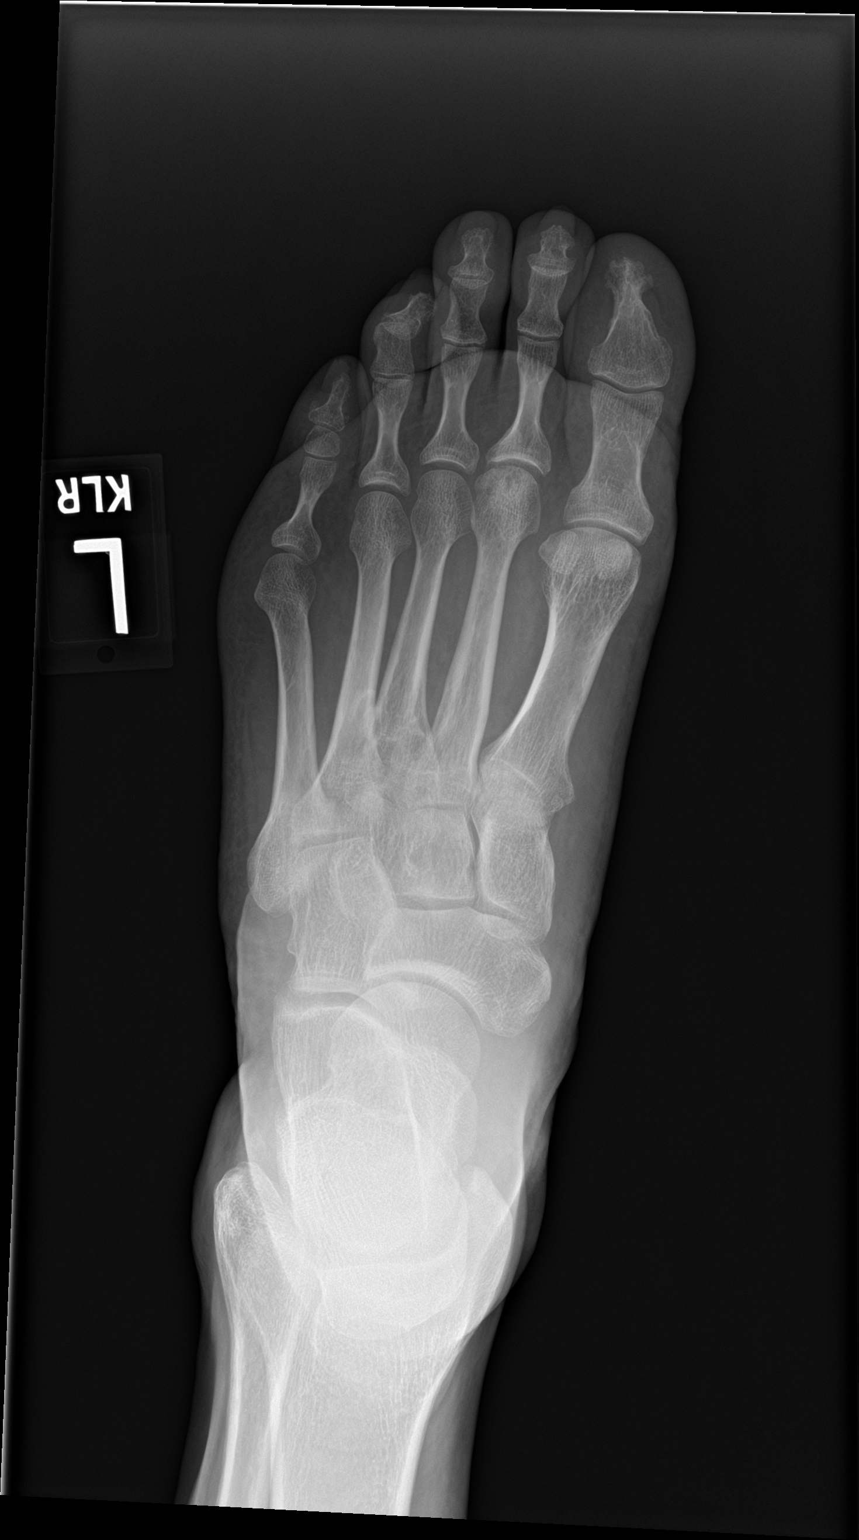
[im 2/2]
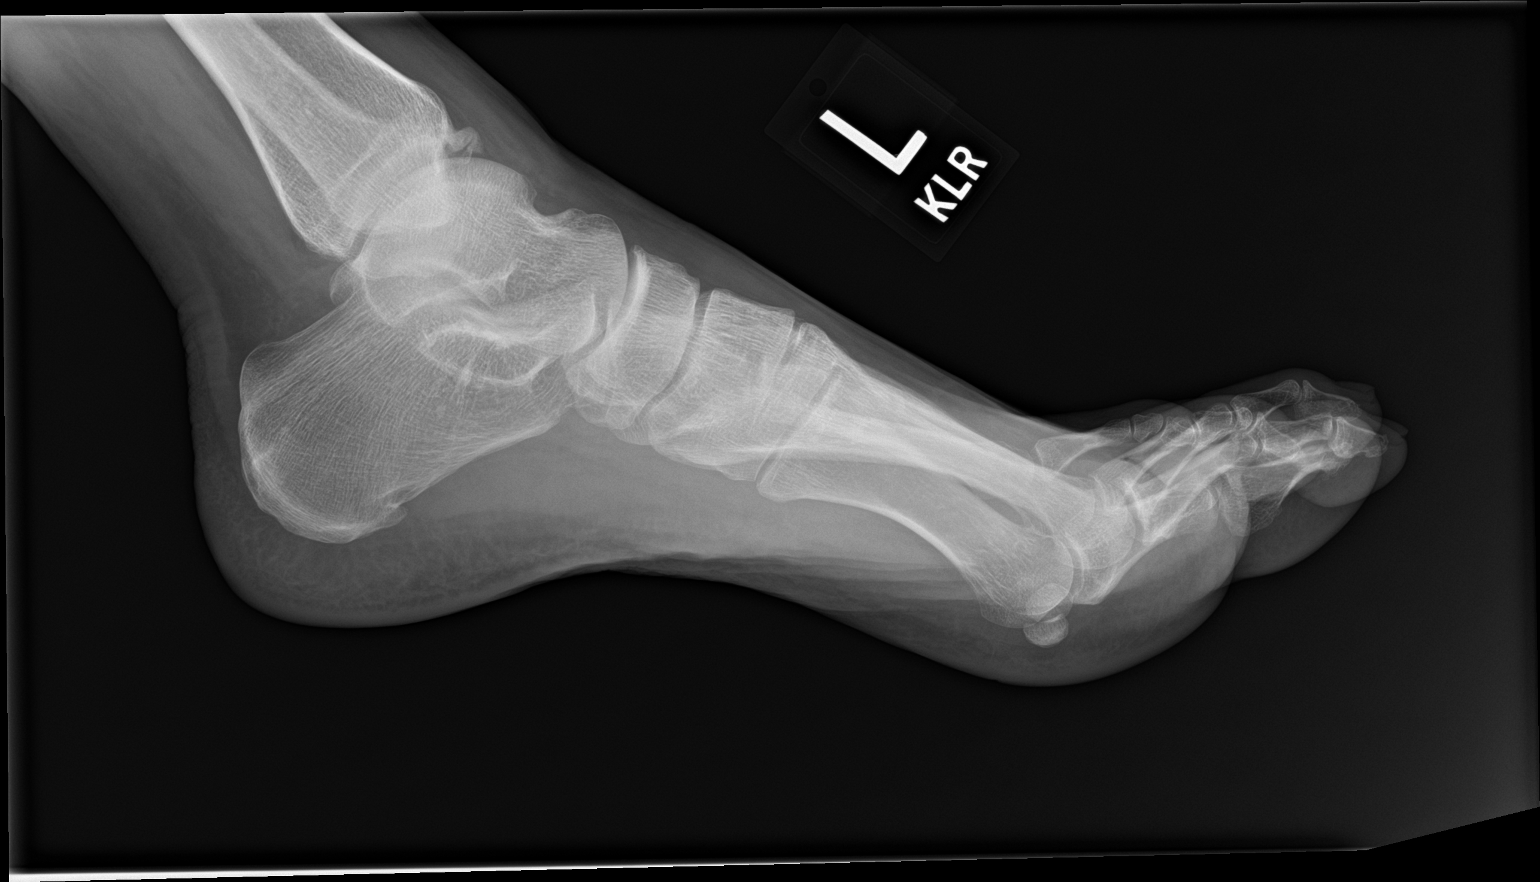

[2 of 2 positions shown; findings below may reference images not displayed]

FINDINGS: Sclerosis within the second metatarsal head likely represents
osteonecrosis/AVN.

Remote fracture of the proximal third metatarsal noted.

Mild degenerative changes along the anterior ankle noted.

No other significant abnormalities noted.
IMPRESSION: Second metatarsal head sclerosis likely representing
osteonecrosis/AVN.

Remote third metatarsal fracture

## 2019-04-23 IMAGING — DX DG CHEST 1V PORT
1 series · 1 of 1 positions shown · non-contrast
Comparison: None.

CLINICAL DATA: Acute onset of shortness of breath and cough.

EXAM:
PORTABLE CHEST 1 VIEW

[chest ap]
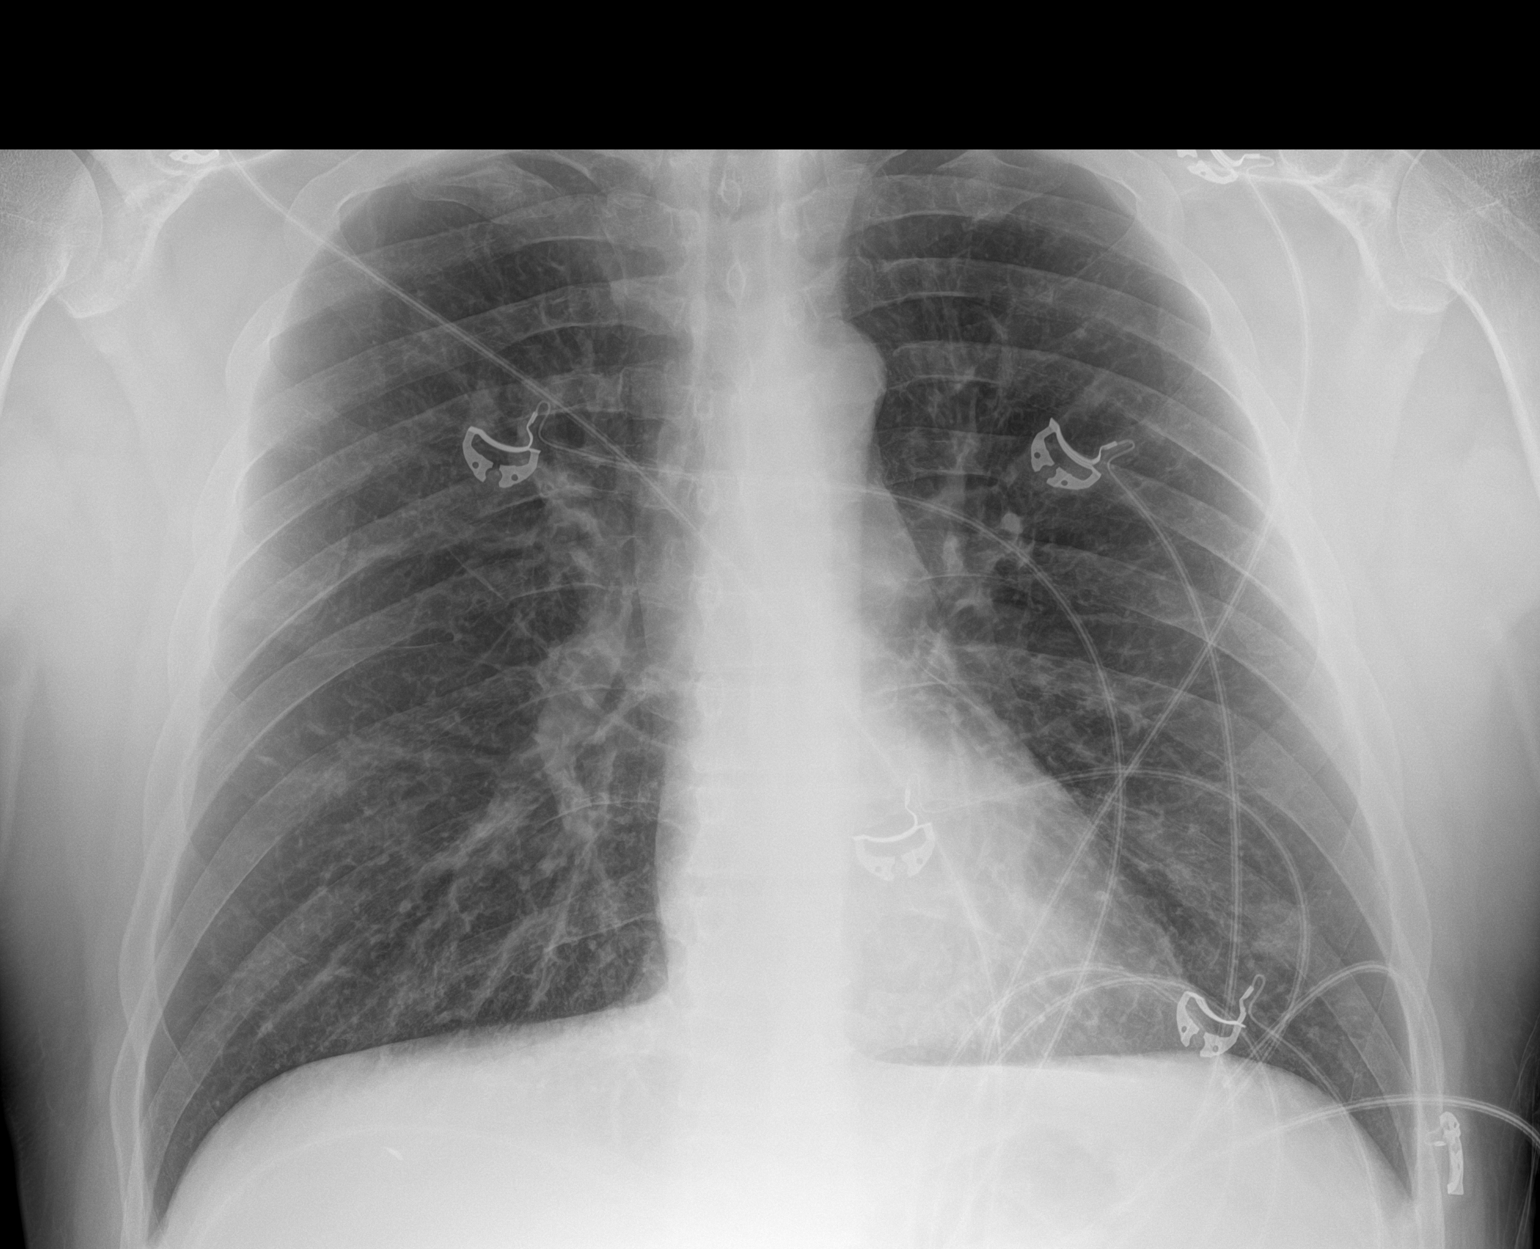

[1 of 1 positions shown; findings below may reference images not displayed]

FINDINGS: The lungs are well-aerated. Minimal bibasilar opacities may reflect
atelectasis. There is no evidence of pleural effusion or
pneumothorax.

The cardiomediastinal silhouette is within normal limits. No acute
osseous abnormalities are seen.
IMPRESSION: Minimal bibasilar opacities may reflect atelectasis. Lungs otherwise
clear.

## 2019-04-24 IMAGING — CR DG CHEST 2V
2 series · 2 of 2 positions shown · non-contrast
Comparison: December 20, 2017

CLINICAL DATA: Shortness of breath

EXAM:
CHEST - 2 VIEW

[chest pa]
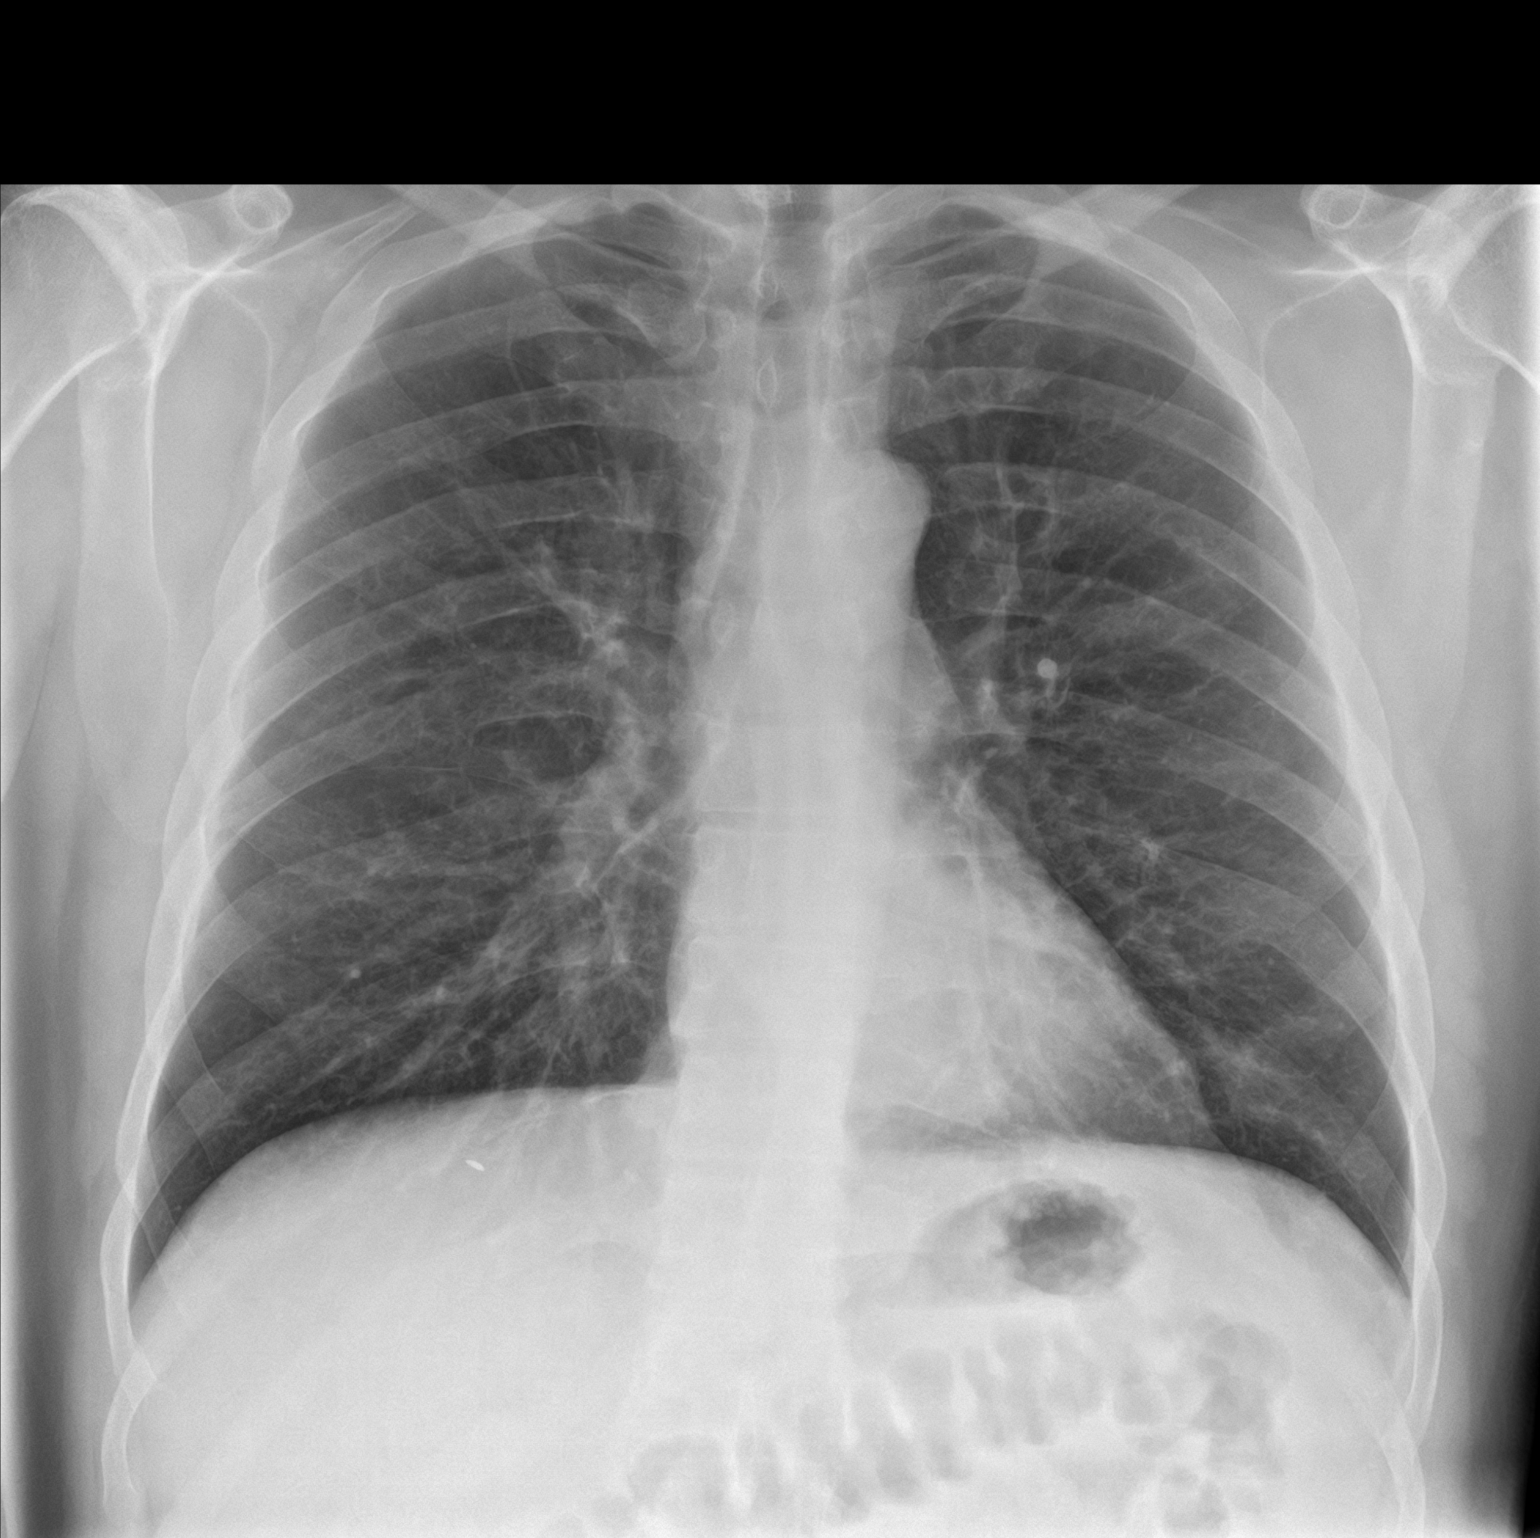

[chest lat]
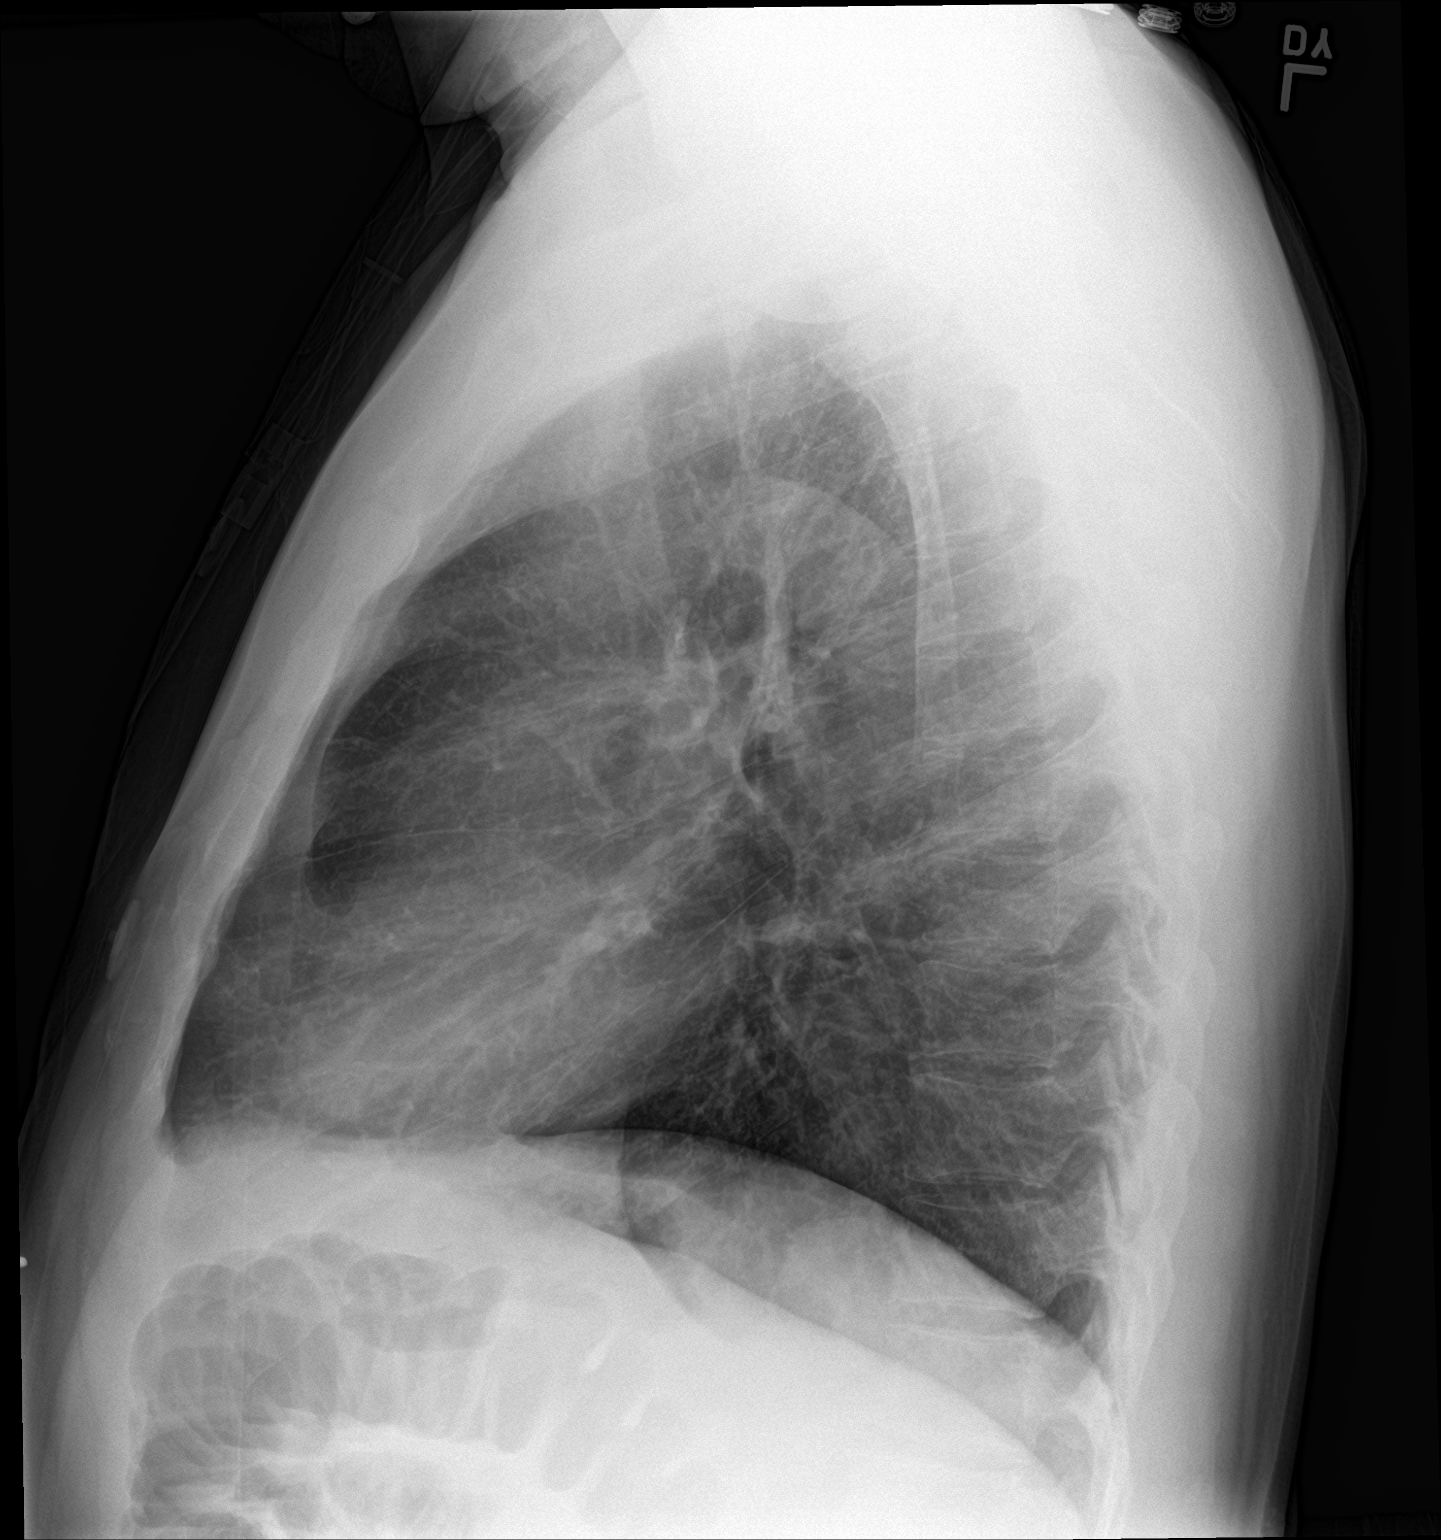

[2 of 2 positions shown; findings below may reference images not displayed]

FINDINGS: There is no edema or consolidation. There is mild scarring in each
upper lobe. Heart size and pulmonary vascularity are normal. No
adenopathy. No evident bone lesions.
IMPRESSION: Mild scarring in the upper lobes.  No edema or consolidation.

## 2020-02-19 ENCOUNTER — Telehealth: Payer: Self-pay

## 2020-02-19 NOTE — Telephone Encounter (Signed)
Attempted to call new patient to notify him that he needed to fill out the new patient questionaire.
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
## 2020-02-23 ENCOUNTER — Ambulatory Visit: Payer: Medicare Other | Attending: Pain Medicine | Admitting: Pain Medicine

## 2020-02-23 ENCOUNTER — Other Ambulatory Visit: Payer: Self-pay

## 2020-02-23 ENCOUNTER — Encounter: Payer: Self-pay | Admitting: Pain Medicine

## 2020-02-23 VITALS — BP 141/96 | HR 97 | Temp 98.6°F | Resp 17 | Ht 75.0 in | Wt 240.0 lb

## 2020-02-23 DIAGNOSIS — R6884 Jaw pain: Secondary | ICD-10-CM | POA: Insufficient documentation

## 2020-02-23 DIAGNOSIS — M542 Cervicalgia: Secondary | ICD-10-CM | POA: Insufficient documentation

## 2020-02-23 DIAGNOSIS — M4310 Spondylolisthesis, site unspecified: Secondary | ICD-10-CM | POA: Insufficient documentation

## 2020-02-23 DIAGNOSIS — G894 Chronic pain syndrome: Secondary | ICD-10-CM | POA: Insufficient documentation

## 2020-02-23 DIAGNOSIS — G8929 Other chronic pain: Secondary | ICD-10-CM | POA: Diagnosis present

## 2020-02-23 DIAGNOSIS — M899 Disorder of bone, unspecified: Secondary | ICD-10-CM | POA: Insufficient documentation

## 2020-02-23 DIAGNOSIS — Z79899 Other long term (current) drug therapy: Secondary | ICD-10-CM | POA: Insufficient documentation

## 2020-02-23 DIAGNOSIS — Z789 Other specified health status: Secondary | ICD-10-CM | POA: Insufficient documentation

## 2020-02-23 DIAGNOSIS — M545 Low back pain: Secondary | ICD-10-CM | POA: Insufficient documentation

## 2020-02-23 NOTE — Patient Instructions (Signed)
____________________________________________________________________________________________  General Risks and Possible Complications  Patient Responsibilities: It is important that you read this as it is part of your informed consent. It is our duty to inform you of the risks and possible complications associated with treatments offered to you. It is your responsibility as a patient to read this and to ask questions about anything that is not clear or that you believe was not covered in this document.  Patient's Rights: You have the right to refuse treatment. You also have the right to change your mind, even after initially having agreed to have the treatment done. However, under this last option, if you wait until the last second to change your mind, you may be charged for the materials used up to that point.  Introduction: Medicine is not an exact science. Everything in Medicine, including the lack of treatment(s), carries the potential for danger, harm, or loss (which is by definition: Risk). In Medicine, a complication is a secondary problem, condition, or disease that can aggravate an already existing one. All treatments carry the risk of possible complications. The fact that a side effects or complications occurs, does not imply that the treatment was conducted incorrectly. It must be clearly understood that these can happen even when everything is done following the highest safety standards.  No treatment: You can choose not to proceed with the proposed treatment alternative. The "PRO(s)" would include: avoiding the risk of complications associated with the therapy. The "CON(s)" would include: not getting any of the treatment benefits. These benefits fall under one of three categories: diagnostic; therapeutic; and/or palliative. Diagnostic benefits include: getting information which can ultimately lead to improvement of the disease or symptom(s). Therapeutic benefits are those associated with the  successful treatment of the disease. Finally, palliative benefits are those related to the decrease of the primary symptoms, without necessarily curing the condition (example: decreasing the pain from a flare-up of a chronic condition, such as incurable terminal cancer).  General Risks and Complications: These are associated to most interventional treatments. They can occur alone, or in combination. They fall under one of the following six (6) categories: no benefit or worsening of symptoms; bleeding; infection; nerve damage; allergic reactions; and/or death. 1. No benefits or worsening of symptoms: In Medicine there are no guarantees, only probabilities. No healthcare provider can ever guarantee that a medical treatment will work, they can only state the probability that it may. Furthermore, there is always the possibility that the condition may worsen, either directly, or indirectly, as a consequence of the treatment. 2. Bleeding: This is more common if the patient is taking a blood thinner, either prescription or over the counter (example: Goody Powders, Fish oil, Aspirin, Garlic, etc.), or if suffering a condition associated with impaired coagulation (example: Hemophilia, cirrhosis of the liver, low platelet counts, etc.). However, even if you do not have one on these, it can still happen. If you have any of these conditions, or take one of these drugs, make sure to notify your treating physician. 3. Infection: This is more common in patients with a compromised immune system, either due to disease (example: diabetes, cancer, human immunodeficiency virus [HIV], etc.), or due to medications or treatments (example: therapies used to treat cancer and rheumatological diseases). However, even if you do not have one on these, it can still happen. If you have any of these conditions, or take one of these drugs, make sure to notify your treating physician. 4. Nerve Damage: This is more common when the   treatment is  an invasive one, but it can also happen with the use of medications, such as those used in the treatment of cancer. The damage can occur to small secondary nerves, or to large primary ones, such as those in the spinal cord and brain. This damage may be temporary or permanent and it may lead to impairments that can range from temporary numbness to permanent paralysis and/or brain death. 5. Allergic Reactions: Any time a substance or material comes in contact with our body, there is the possibility of an allergic reaction. These can range from a mild skin rash (contact dermatitis) to a severe systemic reaction (anaphylactic reaction), which can result in death. 6. Death: In general, any medical intervention can result in death, most of the time due to an unforeseen complication. ____________________________________________________________________________________________
